# Patient Record
Sex: Male | Born: 1970 | Race: Black or African American | Hispanic: No | Marital: Married | Smoking: Never smoker
Health system: Southern US, Community
[De-identification: ages and names within clinical notes are randomized; demographics above are authoritative.]

---

## 2001-10-17 ENCOUNTER — Emergency Department (HOSPITAL_COMMUNITY): Admission: EM | Admit: 2001-10-17 | Discharge: 2001-10-17 | Payer: Self-pay | Admitting: Emergency Medicine

## 2005-05-18 ENCOUNTER — Encounter (INDEPENDENT_AMBULATORY_CARE_PROVIDER_SITE_OTHER): Payer: Self-pay | Admitting: *Deleted

## 2005-05-18 ENCOUNTER — Emergency Department (HOSPITAL_COMMUNITY): Admission: EM | Admit: 2005-05-18 | Discharge: 2005-05-18 | Payer: Self-pay | Admitting: Emergency Medicine

## 2005-05-25 ENCOUNTER — Emergency Department (HOSPITAL_COMMUNITY): Admission: EM | Admit: 2005-05-25 | Discharge: 2005-05-25 | Payer: Self-pay | Admitting: Emergency Medicine

## 2008-09-24 ENCOUNTER — Emergency Department (HOSPITAL_COMMUNITY): Admission: EM | Admit: 2008-09-24 | Discharge: 2008-09-24 | Payer: Self-pay | Admitting: Emergency Medicine

## 2013-08-09 ENCOUNTER — Emergency Department (HOSPITAL_COMMUNITY)
Admission: EM | Admit: 2013-08-09 | Discharge: 2013-08-09 | Disposition: A | Discharge status: Home / Self Care | Payer: Self-pay | Attending: Emergency Medicine | Admitting: Emergency Medicine

## 2013-08-09 ENCOUNTER — Emergency Department (HOSPITAL_COMMUNITY): Payer: Self-pay

## 2013-08-09 ENCOUNTER — Encounter (HOSPITAL_COMMUNITY): Payer: Self-pay | Admitting: Emergency Medicine

## 2013-08-09 DIAGNOSIS — S93499A Sprain of other ligament of unspecified ankle, initial encounter: Secondary | ICD-10-CM | POA: Insufficient documentation

## 2013-08-09 DIAGNOSIS — S8990XA Unspecified injury of unspecified lower leg, initial encounter: Secondary | ICD-10-CM | POA: Insufficient documentation

## 2013-08-09 DIAGNOSIS — X500XXA Overexertion from strenuous movement or load, initial encounter: Secondary | ICD-10-CM | POA: Insufficient documentation

## 2013-08-09 DIAGNOSIS — Y9367 Activity, basketball: Secondary | ICD-10-CM | POA: Insufficient documentation

## 2013-08-09 DIAGNOSIS — S99929A Unspecified injury of unspecified foot, initial encounter: Secondary | ICD-10-CM

## 2013-08-09 DIAGNOSIS — S96819A Strain of other specified muscles and tendons at ankle and foot level, unspecified foot, initial encounter: Principal | ICD-10-CM

## 2013-08-09 DIAGNOSIS — S99919A Unspecified injury of unspecified ankle, initial encounter: Secondary | ICD-10-CM

## 2013-08-09 DIAGNOSIS — Y92838 Other recreation area as the place of occurrence of the external cause: Secondary | ICD-10-CM

## 2013-08-09 DIAGNOSIS — S86011A Strain of right Achilles tendon, initial encounter: Secondary | ICD-10-CM

## 2013-08-09 DIAGNOSIS — Y9239 Other specified sports and athletic area as the place of occurrence of the external cause: Secondary | ICD-10-CM | POA: Insufficient documentation

## 2013-08-09 MED ORDER — HYDROCODONE-ACETAMINOPHEN 5-325 MG PO TABS: 1.0000 | ORAL_TABLET | ORAL | Status: DC | PRN

## 2013-08-09 NOTE — ED Provider Notes (Signed)
Patient seen and evaluated with Burgess AmorJulie Idol, PA. Exam shows complete disruption of the right Achilles tendon. No ecchymosis. Minimal early swelling. Plan orthopedic consultation, splint, crutches, nonweightbearing. Outpatient followup for surgical repair.  Rolland PorterMark Mirage Pfefferkorn, MD 08/09/13 [phone]

## 2013-08-09 NOTE — ED Notes (Signed)
Swelling pain rt ankle, injury yesterday when playing basketball.  Good DP pulse.  Unable to palp. Achilles tendon

## 2013-08-09 NOTE — ED Provider Notes (Signed)
CSN: [id number]     Arrival date & time 08/09/13  1753 History  This chart was scribed for non-physician practitioner, Burgess Amor, PA-C,working with Rolland Porter, MD, by Karle Plumber, ED Scribe. This patient was seen in room APFT22/APFT22 and the patient's care was started at 7:10 PM.  Chief Complaint  Patient presents with  . Ankle Pain  . Leg Pain   Patient is a 43 y.o. male presenting with ankle pain and leg pain. The history is provided by the patient. No language interpreter was used.  Ankle Pain Associated symptoms: no fever   Leg Pain Associated symptoms: no fever    HPI Comments:  Larry Mccarthy is a 43 y.o. male who presents to the Emergency Department complaining of severe pain and swelling of his right ankle pain that started yesterday secondary to playing basketball. He states he stepped on the foot wrong and feels as if he injured his achilles tendon. He states he did not feel a pop but thinks he may have heard a popping noise at the time of the incident. He reports alternating between heat and ice soaks earlier today. He denies taking anything for pain. He denies numbness, tingling, bruising or laceration to the area. He states he does not have a PCP.   History reviewed. No pertinent past medical history. History reviewed. No pertinent past surgical history. Family History  Problem Relation Age of Onset  . Cancer Father    History  Substance Use Topics  . Smoking status: Never Smoker   . Smokeless tobacco: Never Used  . Alcohol Use: 21.6 oz/week    1 Cans of beer, 35 Shots of liquor per week    Review of Systems  Constitutional: Negative for fever.  Musculoskeletal: Positive for arthralgias and joint swelling. Negative for myalgias.  Neurological: Negative for weakness and numbness.    Allergies  Review of patient's allergies indicates no known allergies.  Home Medications   Prior to Admission medications   Medication Sig Start Date End Date Taking? Authorizing  Provider  HYDROcodone-acetaminophen (NORCO/VICODIN) 5-325 MG per tablet Take 1 tablet by mouth every 4 (four) hours as needed. 08/09/13   Burgess Amor, PA-C   Triage Vitals: BP 99/85  Pulse 63  Temp(Src) 98.7 F (37.1 C) (Oral)  Resp 20  Ht 6\' 1"  (1.854 m)  Wt 165 lb (74.844 kg)  BMI 21.77 kg/m2  SpO2 100% Physical Exam  Constitutional: He appears well-developed and well-nourished.  HENT:  Head: Atraumatic.  Neck: Normal range of motion.  Cardiovascular:  Pulses equal bilaterally  Musculoskeletal: He exhibits edema and tenderness.  Moderate edema at right medial malleolus. Suspected disruption of achilles tendon distally. Positive Thompson test. Full pedal pulses.  Neurological: He is alert. He has normal strength. He displays normal reflexes. No sensory deficit.  Skin: Skin is warm and dry.  Psychiatric: He has a normal mood and affect.    ED Course  Procedures (including critical care time) DIAGNOSTIC STUDIES: Oxygen Saturation is 100% on RA, normal by my interpretation.   COORDINATION OF CARE: 7:15 PM- Will prescribe pain medication and refer to orthopedist. Offered pt pain medication prior to discharge but he declined. Pt verbalizes understanding and agrees to plan.  Medications - No data to display  Labs Review Labs Reviewed - No data to display  Imaging Review No results found.   EKG Interpretation None      MDM   Final diagnoses:  Achilles rupture, right, initial encounter    Exam c/w  complete achilles tear.  Pt placed in posterior splint, crutches given.  Hydrocodone, non weight bearing.  Referral to ortho for f/u in 24 hours. Pt was seen by Dr Fayrene FearingJames during this visit.  I personally performed the services described in this documentation, which was scribed in my presence. The recorded information has been reviewed and is accurate.    Burgess AmorJulie Delona Clasby, PA-C 08/23/13 1605

## 2013-08-09 NOTE — Discharge Instructions (Signed)
Achilles Tendon Rupture °with Phase I Rehab °A complete tear in the Achilles tendon is known as an Achilles tendon rupture. The Achilles tendon, which is also known as the heel cord, connects the large calf muscles (gastrocnemius and soleus) to the heel bone (calcaneus) and is essential for proper functioning of the calf muscles. The calf muscles are required for pushing the foot downward and are necessary for walking, running, and jumping. °SYMPTOMS  °· A "pop" or tear may be felt at the time of injury. °· Pain and weakness during movement, especially when raising the heel. °· Tenderness, swelling, warmth, and redness around the Achilles tendon. °· Bruising (contusion) around the back of the ankle after 48 hours. °· Loss of firmness to the touch over the area of the Achilles tendon that ruptured. °CAUSES  °· Achilles tendon rupture is most commonly caused by a sudden force placed upon the Achilles tendon that is greater than the tendon can withstand (for example jumping, hurdling, or sprinting). °· Achilles tendon rupture may also occur from direct trauma or injury to the lower leg, foot, or ankle. °RISK INCREASES WITH: °· Physical activity that involves sudden muscle contraction. °· Poor strength and flexibility of the lower leg. °· Previous injury to the tendon. °· Untreated Achilles tendinitis. °· Steroid injection into the Achilles tendon. °· Medical conditions, such as poor circulation due to a cardiovascular condition or obesity. °PREVENTION  °· Include a proper warm-up and stretching routine before physical activity. °· Allow for rest and recovery between physical activity. °· Maintain lower leg fitness: °¨ Flexibility. °¨ Muscle strength. °¨ Muscular endurance. °¨ Cardiovascular fitness. °· Mechanical prevention: °¨ Taping. °¨ Protective strapping. °¨ An adhesive bandaging that has been recommended prior to physical activity. °TREATMENT  °Initially one should not walk on the affected leg. One should also  ice the injured area, apply compression with an elastic bandage, and elevate the injured leg to eye level. Both surgical and nonsurgical interventions exist for definitive treatment. The return to sports is usually about the same with either treatment course but can occur a few weeks sooner with surgery.  °· Nonsurgical treatment typically requires the immobilization in the form of a long leg cast (from toes to groin) for 4 to 9 weeks. The long leg cast is followed by a short leg cast or walking boot for an additional 4 to 12 weeks. °¨ Advantages: Non-surgical treatment lacks the risks involved with anesthesia or surgery (such as infection, bleeding, or injury to nerves). °¨ Disadvantages: Non-surgical treatment involves a longer period of immobilization. This may result in stiffer ankle and knee joints. Also, the calf muscles are slightly weaker and the risk of repeat rupture is higher. °· Surgical treatment typically requires sewing the ends of the tendon back together, followed by immobilization (typically a short leg cast or walking boot). °¨ Advantages: Surgical treatment does not usually require the immobilization of the knee. Surgery also offers a lower risk of repeat rupture of the tendon and slightly stronger calf muscles. °¨ Disadvantages: Surgical treatment does include the risks of anesthesia and surgery. These risks include impaired wound healing and injury to a nerve that provides sensation to the side of the foot. °PROGNOSIS  °· Achilles tendon ruptures are typically curable if treated correctly. °· A period of 4 to 9 months is typical before a return to sports. °RELATED COMPLICATIONS  °· Calf muscle weakness may occur, especially if the rupture goes untreated. °· The possibility of repeat rupture exists despite treatment. °·   Prolonged disability can occur. °· Risks of surgery include infection, bleeding, injury to nerves, and impaired wound healing. °MEDICATION  °· If pain medication is necessary,  then nonsteroidal anti-inflammatory medications, such as aspirin and ibuprofen, or other minor pain relievers, such as acetaminophen, are often recommended. °· Do not take pain medication within 7 days before surgery. °· All medications should be taken under the direction of your caregiver. Contact your caregiver immediately if any bleeding, stomach upset, or signs of allergic reaction occur. °· Prescription pain medication may be prescribed as necessary by your caregiver. Use only as directed and only as much as you need. °SEEK MEDICAL CARE IF:  °· Pain continues to increase, despite treatment. °· Cast discomfort develops. °· New, unexplained symptoms develop. °· You are experiencing any side effects form the drugs used in treatment. °EXERCISES  °PHASE I EXERCISES °RANGE OF MOTION (ROM) AND STRETCHING EXERCISES - Achilles Tendon Rupture Phase I  °These exercises will help you begin to restore your ankle flexibility in the first 2 to 4 weeks after your cast is removed. Your physician, physical therapist, or athletic trainer will progress your exercise once you have demonstrated the sufficient gains in flexibility and strength. While completing these exercises, remember:  °· Restoring tissue flexibility helps normal motion to return to the joints. This allows healthier, less painful movement and activity. °· An effective stretch should be held for at least 30 seconds. °· A stretch should never be painful. You should only feel a gentle lengthening or release in the stretched tissue. °RANGE OF MOTION - Dorsi/Plantar Flexion °· While sitting with your right / left knee straight, draw the top of your foot upwards by flexing your ankle. Then reverse the motion, pointing your toes downward. °· Hold each position for __________ seconds. °· After completing your first set of exercises, repeat this exercise with your knee bent. °Repeat __________ times. Complete this exercise __________ times per day.  °RANGE OF MOTION -  Ankle Alphabet °· Imagine your right / left big toe is a pen. °· Keeping your hip and knee still, write out the entire alphabet with your "pen." Make the letters as large as you can without increasing any discomfort. °Repeat __________ times. Complete this exercise __________ times per day.  °RANGE OF MOTION - Ankle Dorsiflexion, Active Assisted  °· Remove shoes and sit on a chair that is preferably not on a carpeted surface. °· Place right / left foot under knee. Extend your opposite leg for support. °· Keeping your heel down, slide your right / left foot back toward the chair until you feel a stretch at your ankle or calf. If you do not feel a stretch, slide your bottom forward to the edge of the chair, while still keeping your heel down. °· Hold this stretch for __________ seconds. °Repeat __________ times. Complete this stretch __________ times per day.  °STRETCH - Gastrocsoleus  °· Sit with your right / left leg extended. Holding onto both ends of a belt or towel, loop it around the ball of your foot. °· Keeping your right / left ankle and foot relaxed and your knee straight, pull your foot and ankle toward you using the belt/towel. °· You should feel a gentle stretch behind your calf or knee. Hold this position for __________ seconds. °Repeat __________ times. Complete this stretch __________ times per day.  °STRENGTHENING EXERCISES - Achilles Tendon Rupture Phase I  °These exercises will help you begin to restore your ankle strength the first 2 to   4 weeks after your cast is removed. Your physician, physical therapist, or athletic trainer will progress your exercise once you have demonstrated the sufficient gains in flexibility and strength. While completing these exercises, remember:  °· Muscles can gain both the endurance and the strength needed for everyday activities through controlled exercises. °· Complete these exercises as instructed by your physician, physical therapist, or athletic trainer. Progress  the resistance and repetitions only as guided. °· You may experience muscle soreness or fatigue, but the pain or discomfort you are trying to eliminate should never worsen during these exercises. If this pain does worsen, stop and make certain you are following the directions exactly. If the pain is still present after adjustments, discontinue the exercise until you can discuss the trouble with your clinician. °STRENGTH - Dorsiflexors °· Secure a rubber exercise band/tubing to a fixed object (for example a table or pole) and loop the other end around your right / left foot. °· Sit on the floor facing the fixed object. The band/tubing should be slightly tense when your foot is relaxed. °· Slowly draw your foot back toward you using your ankle and toes. °· Hold this position for __________ seconds. Slowly release the tension in the band and return your foot to the starting position. °Repeat __________ times. Complete this exercise __________ times per day.  °STRENGTH - Plantar-flexors  °· Sit with your right / left leg extended. Holding onto both ends of a rubber exercise band/tubing, loop it around the ball of your foot. Keep a slight tension in the band. °· Slowly push your toes away from you, pointing them downward. °· Hold this position for __________ seconds. Return slowly, controlling the tension in the band/tubing. °Repeat __________ times. Complete this exercise __________ times per day.  °STRENGTH - Towel Curls °· Sit in a chair positioned on a non-carpeted surface. °· Place your foot on a towel, keeping your heel on the floor. °· Pull the towel toward your heel by only curling your toes. Keep your heel on the floor. °· If instructed by your physician, physical therapist or athletic trainer, add weight to the end of the towel. °Repeat __________ times. Complete this exercise __________ times per day. °STRENGTH - Ankle Eversion  °· Secure one end of a rubber exercise band/tubing to a fixed object (table,  pole). Loop the other end around your foot just before your toes. °· Place your fists between your knees. This will focus your strengthening at your ankle. °· Drawing the band/tubing across your opposite foot, slowly, pull your little toe out and up. Make sure the band/tubing is positioned to resist the entire motion. °· Hold this position for __________ seconds. °· Have your muscles resist the band/tubing as it slowly pulls your foot back to the starting position. °Repeat __________ times. Complete this exercise __________ times per day.  °STRENGTH - Ankle Inversion °· Secure one end of a rubber exercise band/tubing to a fixed object (table, pole). Loop the other end around your foot just before your toes. °· Place your fists between your knees. This will focus your strengthening at your ankle. °· Slowly, pull your big toe up and in, making sure the band/tubing is positioned to resist the entire motion. °· Hold this position for __________ seconds. °· Have your muscles resist the band/tubing as it slowly pulls your foot back to the starting position. °Repeat __________ times. Complete this exercises __________ times per day.  °STRENGTH - Plantar Flexors, Seated  °· Sit on a chair   that allows your feet to rest flat on the ground. If necessary, sit at the edge of the chair. °· Keeping your toes firmly on the ground, lift your right / left heel as far as you can without increasing any discomfort in your ankle. °Repeat __________ times. Complete this exercise __________ times a day. °*If instructed by your physician, physical therapist, or athletic trainer, you may add ____________________ of resistance by placing a weighted object on your knee. °Document Released: 12/24/2004 Document Revised: 03/18/2011 Document Reviewed: 04/07/2008 °[employer]® Patient Information ©2015 [employer]. This information is not intended to replace advice given to you by your health care provider. Make sure you discuss any questions you  have with your health care provider. ° °

## 2013-08-09 NOTE — ED Notes (Signed)
Patient c/o lower leg and ankle pain. Patient reports hurting ankle while playing basketball yesterday. Swelling noted in ankle.

## 2013-08-24 NOTE — ED Provider Notes (Signed)
Medical screening examination/treatment/procedure(s) were performed by non-physician practitioner and as supervising physician I was immediately available for consultation/collaboration.   EKG Interpretation None        Andersyn Fragoso, MD 08/24/13 0650 

## 2018-07-05 ENCOUNTER — Observation Stay (HOSPITAL_COMMUNITY): Payer: Self-pay

## 2018-07-05 ENCOUNTER — Emergency Department (HOSPITAL_COMMUNITY): Payer: Self-pay

## 2018-07-05 ENCOUNTER — Encounter (HOSPITAL_COMMUNITY): Payer: Self-pay

## 2018-07-05 ENCOUNTER — Observation Stay (HOSPITAL_COMMUNITY)
Admission: EM | Admit: 2018-07-05 | Discharge: 2018-07-06 | Disposition: A | Discharge status: Home / Self Care | Payer: Self-pay | Attending: Surgery | Admitting: Surgery

## 2018-07-05 DIAGNOSIS — S2241XA Multiple fractures of ribs, right side, initial encounter for closed fracture: Secondary | ICD-10-CM | POA: Insufficient documentation

## 2018-07-05 DIAGNOSIS — S12391A Other nondisplaced fracture of fourth cervical vertebra, initial encounter for closed fracture: Principal | ICD-10-CM | POA: Insufficient documentation

## 2018-07-05 DIAGNOSIS — J939 Pneumothorax, unspecified: Secondary | ICD-10-CM | POA: Insufficient documentation

## 2018-07-05 DIAGNOSIS — R918 Other nonspecific abnormal finding of lung field: Secondary | ICD-10-CM | POA: Insufficient documentation

## 2018-07-05 DIAGNOSIS — S12300A Unspecified displaced fracture of fourth cervical vertebra, initial encounter for closed fracture: Secondary | ICD-10-CM | POA: Diagnosis present

## 2018-07-05 DIAGNOSIS — F10920 Alcohol use, unspecified with intoxication, uncomplicated: Secondary | ICD-10-CM | POA: Insufficient documentation

## 2018-07-05 DIAGNOSIS — Y998 Other external cause status: Secondary | ICD-10-CM | POA: Insufficient documentation

## 2018-07-05 DIAGNOSIS — Y9389 Activity, other specified: Secondary | ICD-10-CM | POA: Insufficient documentation

## 2018-07-05 DIAGNOSIS — Y929 Unspecified place or not applicable: Secondary | ICD-10-CM | POA: Insufficient documentation

## 2018-07-05 DIAGNOSIS — S060X1A Concussion with loss of consciousness of 30 minutes or less, initial encounter: Secondary | ICD-10-CM | POA: Insufficient documentation

## 2018-07-05 DIAGNOSIS — Z03818 Encounter for observation for suspected exposure to other biological agents ruled out: Secondary | ICD-10-CM | POA: Insufficient documentation

## 2018-07-05 LAB — URINALYSIS, ROUTINE W REFLEX MICROSCOPIC
Bacteria, UA: NONE SEEN
Bilirubin Urine: NEGATIVE
Glucose, UA: NEGATIVE mg/dL
Ketones, ur: NEGATIVE mg/dL
Leukocytes,Ua: NEGATIVE
Nitrite: NEGATIVE
Protein, ur: NEGATIVE mg/dL
Specific Gravity, Urine: 1.025 (ref 1.005–1.030)
pH: 5 (ref 5.0–8.0)

## 2018-07-05 LAB — COMPREHENSIVE METABOLIC PANEL
ALT: 22 U/L (ref 0–44)
AST: 35 U/L (ref 15–41)
Albumin: 4 g/dL (ref 3.5–5.0)
Alkaline Phosphatase: 42 U/L (ref 38–126)
Anion gap: 10 (ref 5–15)
BUN: 12 mg/dL (ref 6–20)
CO2: 24 mmol/L (ref 22–32)
Calcium: 8.8 mg/dL — ABNORMAL LOW (ref 8.9–10.3)
Chloride: 104 mmol/L (ref 98–111)
Creatinine, Ser: 1.22 mg/dL (ref 0.61–1.24)
GFR calc Af Amer: >60 mL/min (ref >60)
GFR calc non Af Amer: >60 mL/min (ref >60)
Glucose, Bld: 120 mg/dL — ABNORMAL HIGH (ref 70–99)
Potassium: 3.6 mmol/L (ref 3.5–5.1)
Sodium: 138 mmol/L (ref 135–145)
Total Bilirubin: 0.6 mg/dL (ref 0.3–1.2)
Total Protein: 6.3 g/dL — ABNORMAL LOW (ref 6.5–8.1)

## 2018-07-05 LAB — I-STAT CHEM 8, ED
BUN: 14 mg/dL (ref 6–20)
Calcium, Ion: 0.93 mmol/L — ABNORMAL LOW (ref 1.15–1.40)
Chloride: 106 mmol/L (ref 98–111)
Creatinine, Ser: 1.3 mg/dL — ABNORMAL HIGH (ref 0.61–1.24)
Glucose, Bld: 115 mg/dL — ABNORMAL HIGH (ref 70–99)
HCT: 42 % (ref 39.0–52.0)
Hemoglobin: 14.3 g/dL (ref 13.0–17.0)
Potassium: 3.7 mmol/L (ref 3.5–5.1)
Sodium: 138 mmol/L (ref 135–145)
TCO2: 24 mmol/L (ref 22–32)

## 2018-07-05 LAB — CBC
HCT: 39.4 % (ref 39.0–52.0)
Hemoglobin: 14 g/dL (ref 13.0–17.0)
MCH: 31.3 pg (ref 26.0–34.0)
MCHC: 35.5 g/dL (ref 30.0–36.0)
MCV: 87.9 fL (ref 80.0–100.0)
Platelets: 257 10*3/uL (ref 150–400)
RBC: 4.48 MIL/uL (ref 4.22–5.81)
RDW: 12.5 % (ref 11.5–15.5)
WBC: 18.9 10*3/uL — ABNORMAL HIGH (ref 4.0–10.5)
nRBC: 0 % (ref 0.0–0.2)

## 2018-07-05 LAB — SAMPLE TO BLOOD BANK

## 2018-07-05 LAB — PROTIME-INR
INR: 1.1 (ref 0.8–1.2)
Prothrombin Time: 14 seconds (ref 11.4–15.2)

## 2018-07-05 LAB — LACTIC ACID, PLASMA: Lactic Acid, Venous: 2.3 mmol/L (ref 0.5–1.9)

## 2018-07-05 LAB — SARS CORONAVIRUS 2 BY RT PCR (HOSPITAL ORDER, PERFORMED IN ~~LOC~~ HOSPITAL LAB): SARS Coronavirus 2: NEGATIVE

## 2018-07-05 LAB — TROPONIN I (HIGH SENSITIVITY)
Troponin I (High Sensitivity): 2 ng/L (ref <18)
Troponin I (High Sensitivity): 3 ng/L (ref <18)

## 2018-07-05 LAB — HIV ANTIBODY (ROUTINE TESTING W REFLEX): HIV Screen 4th Generation wRfx: NONREACTIVE

## 2018-07-05 LAB — ETHANOL: Alcohol, Ethyl (B): 160 mg/dL — ABNORMAL HIGH (ref <10)

## 2018-07-05 MED ORDER — METHOCARBAMOL 500 MG PO TABS
500.0000 mg | ORAL_TABLET | Freq: Three times a day (TID) | ORAL | Status: DC | PRN
  Filled 2018-07-05: qty 1

## 2018-07-05 MED ORDER — FOLIC ACID 1 MG PO TABS
1.0000 mg | ORAL_TABLET | Freq: Every day | ORAL | Status: DC
  Administered 2018-07-05 – 2018-07-06 (×2): 1 mg via ORAL
  Filled 2018-07-05 (×2): qty 1

## 2018-07-05 MED ORDER — ACETAMINOPHEN 325 MG PO TABS
650.0000 mg | ORAL_TABLET | ORAL | Status: DC | PRN
  Administered 2018-07-05: 650 mg via ORAL
  Filled 2018-07-05 (×2): qty 2

## 2018-07-05 MED ORDER — MORPHINE SULFATE (PF) 2 MG/ML IV SOLN: 2.0000 mg | INTRAVENOUS | Status: DC | PRN

## 2018-07-05 MED ORDER — SODIUM CHLORIDE 0.9 % IV SOLN: INTRAVENOUS | Status: DC

## 2018-07-05 MED ORDER — DOCUSATE SODIUM 100 MG PO CAPS
100.0000 mg | ORAL_CAPSULE | Freq: Two times a day (BID) | ORAL | Status: DC
  Administered 2018-07-05 – 2018-07-06 (×3): 100 mg via ORAL
  Filled 2018-07-05 (×3): qty 1

## 2018-07-05 MED ORDER — IOHEXOL 300 MG/ML  SOLN
100.0000 mL | Freq: Once | INTRAMUSCULAR | Status: AC | PRN
  Administered 2018-07-05: 100 mL via INTRAVENOUS

## 2018-07-05 MED ORDER — LORAZEPAM 2 MG/ML IJ SOLN: 1.0000 mg | Freq: Four times a day (QID) | INTRAMUSCULAR | Status: DC | PRN

## 2018-07-05 MED ORDER — SODIUM CHLORIDE 0.9 % IV BOLUS
1000.0000 mL | Freq: Once | INTRAVENOUS | Status: AC
  Administered 2018-07-05: 01:00:00 1000 mL via INTRAVENOUS

## 2018-07-05 MED ORDER — OXYCODONE HCL 5 MG PO TABS
5.0000 mg | ORAL_TABLET | ORAL | Status: DC | PRN
  Administered 2018-07-05: 5 mg via ORAL
  Filled 2018-07-05: qty 2

## 2018-07-05 MED ORDER — LORAZEPAM 1 MG PO TABS: 1.0000 mg | ORAL_TABLET | Freq: Four times a day (QID) | ORAL | Status: DC | PRN

## 2018-07-05 MED ORDER — VITAMIN B-1 100 MG PO TABS
100.0000 mg | ORAL_TABLET | Freq: Every day | ORAL | Status: DC
  Administered 2018-07-05 – 2018-07-06 (×2): 100 mg via ORAL
  Filled 2018-07-05 (×2): qty 1

## 2018-07-05 MED ORDER — THIAMINE HCL 100 MG/ML IJ SOLN: 100.0000 mg | Freq: Every day | INTRAMUSCULAR | Status: DC

## 2018-07-05 MED ORDER — SODIUM CHLORIDE 0.9 % IV BOLUS
1000.0000 mL | Freq: Once | INTRAVENOUS | Status: AC
  Administered 2018-07-05: 1000 mL via INTRAVENOUS

## 2018-07-05 MED ORDER — SODIUM CHLORIDE 0.9 % IV SOLN
INTRAVENOUS | Status: DC
  Administered 2018-07-05: 07:00:00 via INTRAVENOUS

## 2018-07-05 MED ORDER — ONDANSETRON HCL 4 MG/2ML IJ SOLN: 4.0000 mg | Freq: Four times a day (QID) | INTRAMUSCULAR | Status: DC | PRN

## 2018-07-05 MED ORDER — ADULT MULTIVITAMIN W/MINERALS CH
1.0000 | ORAL_TABLET | Freq: Every day | ORAL | Status: DC
  Administered 2018-07-05 – 2018-07-06 (×2): 1 via ORAL
  Filled 2018-07-05 (×2): qty 1

## 2018-07-05 MED ORDER — ONDANSETRON 4 MG PO TBDP: 4.0000 mg | ORAL_TABLET | Freq: Four times a day (QID) | ORAL | Status: DC | PRN
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
## 2018-07-05 NOTE — ED Notes (Signed)
Please call Shlomie Romig spouse @ [phone] a status update--Brailyn Delman
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
## 2018-07-05 NOTE — Plan of Care (Signed)
Pt has walked the hall with PT, now sitting in chair. Only pain medication given so far was Tylenol.

## 2018-07-05 NOTE — Progress Notes (Signed)
Tele report: SR [phone].
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
## 2018-07-06 ENCOUNTER — Observation Stay (HOSPITAL_COMMUNITY): Payer: Self-pay

## 2018-07-06 MED ORDER — OXYCODONE HCL 5 MG PO TABS: 5.0000 mg | ORAL_TABLET | Freq: Four times a day (QID) | ORAL | 0 refills | Status: DC | PRN
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
## 2018-07-06 NOTE — Progress Notes (Signed)
Patient discharged home. Family here for transport. All discharge paperwork went over with patient. All questions and concerns addressed. IV taken out. All belongings sent with patient. Patient taken down in wheelchair. Larry Mccarthy
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
## 2020-07-03 IMAGING — DX PORTABLE CHEST - 1 VIEW
1 series · 1 of 1 positions shown · non-contrast
Comparison: 07/05/2018; chest CT-07/05/2018

CLINICAL DATA: Evaluate right apical pneumothorax.

EXAM:
PORTABLE CHEST 1 VIEW

[chest ap]
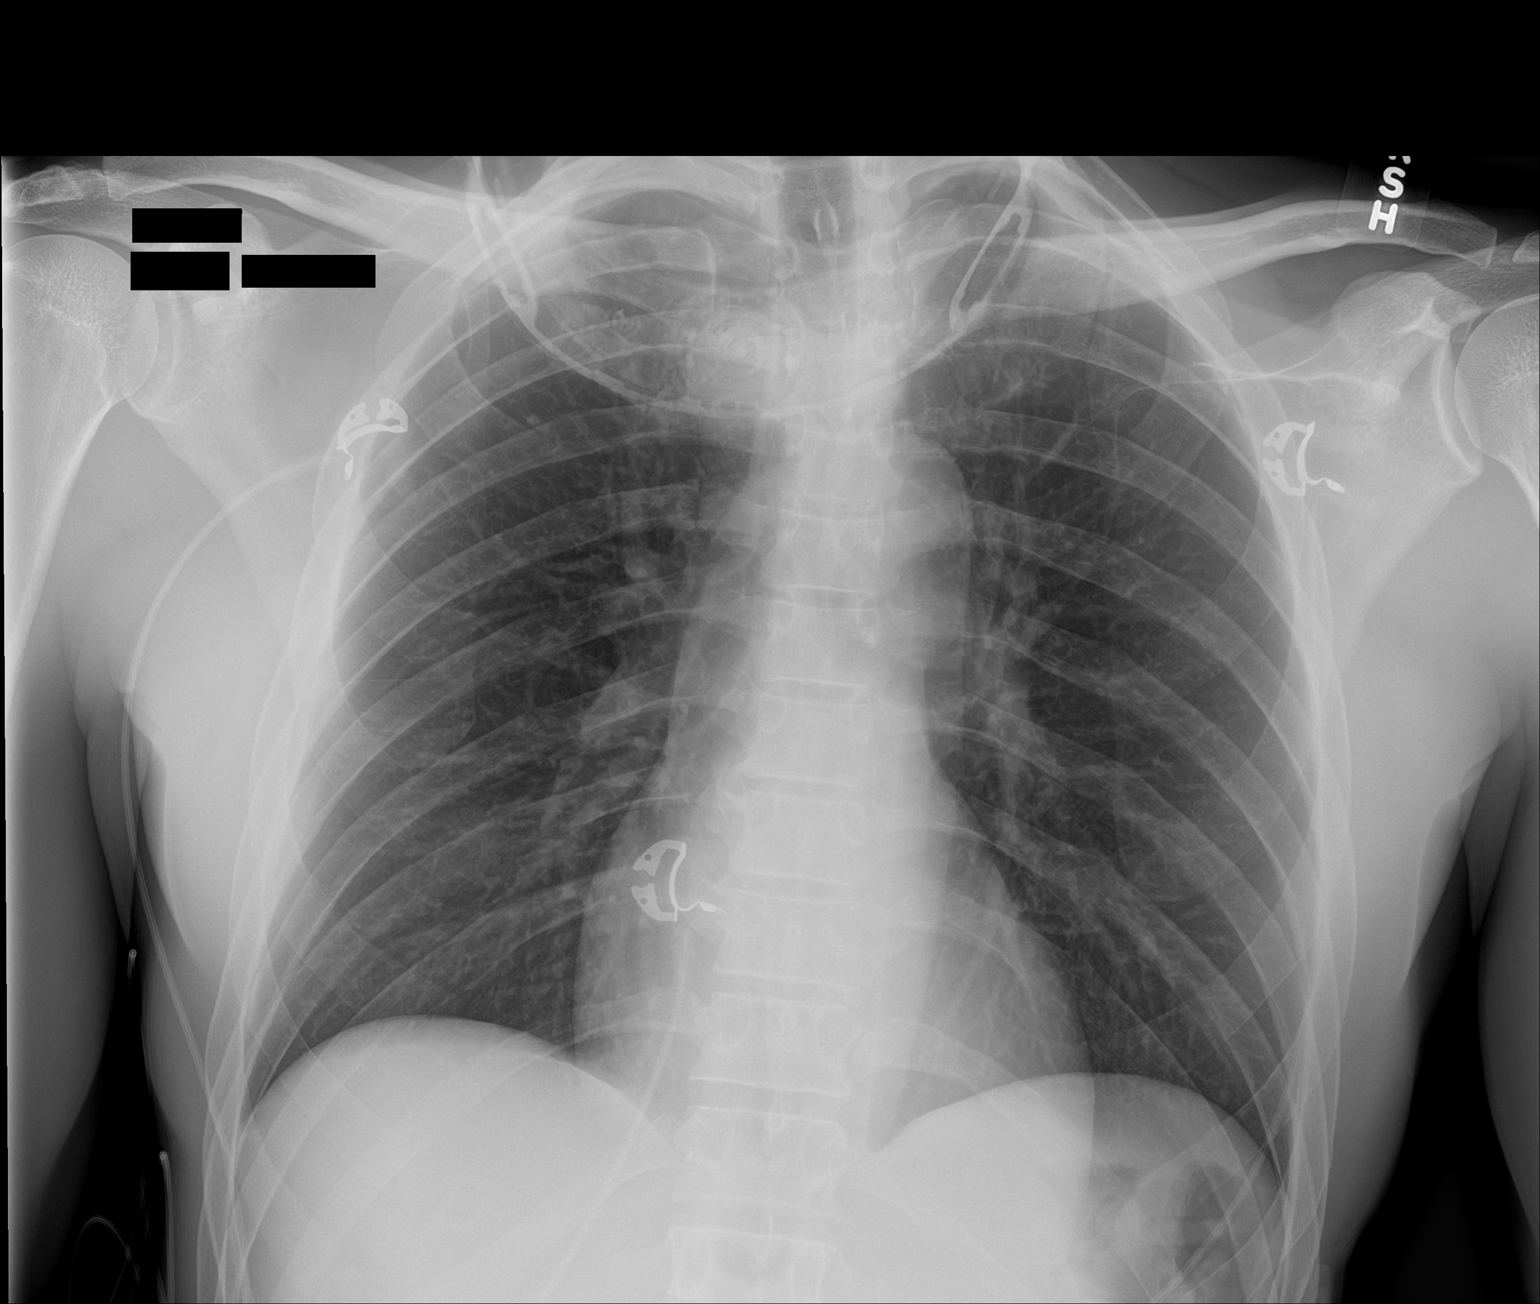

[1 of 1 positions shown; findings below may reference images not displayed]

FINDINGS: Grossly unchanged cardiac silhouette and mediastinal contours.
Unchanged tiny right apical pneumothorax. No focal airspace
opacities. No pleural effusion. No evidence of edema. No acute
osseous abnormalities.
IMPRESSION: Unchanged tiny right apical pneumothorax.

## 2021-03-05 DIAGNOSIS — Z7689 Persons encountering health services in other specified circumstances: Secondary | ICD-10-CM | POA: Diagnosis not present

## 2021-03-05 DIAGNOSIS — Z1211 Encounter for screening for malignant neoplasm of colon: Secondary | ICD-10-CM | POA: Diagnosis not present

## 2021-03-05 DIAGNOSIS — M71349 Other bursal cyst, unspecified hand: Secondary | ICD-10-CM | POA: Diagnosis not present

## 2021-03-05 DIAGNOSIS — R030 Elevated blood-pressure reading, without diagnosis of hypertension: Secondary | ICD-10-CM | POA: Diagnosis not present

## 2021-03-07 ENCOUNTER — Encounter (INDEPENDENT_AMBULATORY_CARE_PROVIDER_SITE_OTHER): Payer: Self-pay | Admitting: *Deleted

## 2021-03-21 DIAGNOSIS — Z7689 Persons encountering health services in other specified circumstances: Secondary | ICD-10-CM | POA: Diagnosis not present

## 2021-03-26 DIAGNOSIS — R2231 Localized swelling, mass and lump, right upper limb: Secondary | ICD-10-CM | POA: Diagnosis not present

## 2021-03-28 DIAGNOSIS — Z0001 Encounter for general adult medical examination with abnormal findings: Secondary | ICD-10-CM | POA: Diagnosis not present

## 2021-04-03 DIAGNOSIS — R2231 Localized swelling, mass and lump, right upper limb: Secondary | ICD-10-CM | POA: Diagnosis not present

## 2021-04-03 DIAGNOSIS — M25441 Effusion, right hand: Secondary | ICD-10-CM | POA: Diagnosis not present

## 2021-04-10 ENCOUNTER — Ambulatory Visit (HOSPITAL_COMMUNITY)
Admission: EM | Admit: 2021-04-10 | Discharge: 2021-04-10 | Disposition: A | Discharge status: Home / Self Care | Payer: BC Managed Care – PPO | Attending: Student | Admitting: Student

## 2021-04-10 ENCOUNTER — Encounter (HOSPITAL_COMMUNITY): Payer: Self-pay

## 2021-04-10 DIAGNOSIS — L03011 Cellulitis of right finger: Secondary | ICD-10-CM

## 2021-04-10 MED ORDER — CEPHALEXIN 500 MG PO CAPS: 500.0000 mg | ORAL_CAPSULE | Freq: Four times a day (QID) | ORAL | 0 refills | Status: DC

## 2021-04-10 NOTE — ED Provider Notes (Signed)
?[facility] ? ? ? ?CSN: [id number] ?Arrival date & time: 04/10/21  1848 ? ? ?  ? ?History   ?Chief Complaint ?Chief Complaint  ?Patient presents with  ? Finger Infection  ? ? ?HPI ?Larry Mccarthy is a 51 y.o. male presenting with right pinky finger pain and swelling for 2 days.  Here today with wife who states that he bites his nails.  States the swelling has only been getting worse, has tried warm compresses without relief.  He is not a diabetic. ? ?HPI ? ?History reviewed. No pertinent past medical history. ? ?Patient Active Problem List  ? Diagnosis Date Noted  ? C4 cervical fracture (HCC) 07/05/2018  ? ? ?History reviewed. No pertinent surgical history. ? ? ? ? ?Home Medications   ? ?Prior to Admission medications   ?Medication Sig Start Date End Date Taking? Authorizing Provider  ?cephALEXin (KEFLEX) 500 MG capsule Take 1 capsule (500 mg total) by mouth 4 (four) times daily. 04/10/21  Yes Rhys Martini, PA-C  ?oxyCODONE (OXY IR/ROXICODONE) 5 MG immediate release tablet Take 1 tablet (5 mg total) by mouth every 6 (six) hours as needed for moderate pain or severe pain (severe pain not controlled with tylenol and ibuprofen). 07/06/18   Andria Meuse, MD  ? ? ?Family History ?Family History  ?Problem Relation Age of Onset  ? Cancer Father   ? ? ?Social History ?Social History  ? ?Tobacco Use  ? Smoking status: Never  ? Smokeless tobacco: Never  ?Substance Use Topics  ? Alcohol use: Yes  ?  Alcohol/week: 36.0 standard drinks  ?  Types: 1 Cans of beer, 35 Shots of liquor per week  ? Drug use: Yes  ?  Types: Marijuana  ? ? ? ?Allergies   ?Patient has no known allergies. ? ? ?Review of Systems ?Review of Systems  ?Skin:  Positive for color change.  ?All other systems reviewed and are negative. ? ? ?Physical Exam ?Triage Vital Signs ?ED Triage Vitals  ?Enc Vitals Group  ?   BP 04/10/21 1922 138/90  ?   Pulse Rate 04/10/21 1922 (!) 58  ?   Resp 04/10/21 1922 17  ?   Temp 04/10/21 1922 98.2 ?F (36.8 ?C)  ?    Temp Source 04/10/21 1922 Oral  ?   SpO2 04/10/21 1922 100 %  ?   Weight --   ?   Height --   ?   Head Circumference --   ?   Peak Flow --   ?   Pain Score 04/10/21 1924 8  ?   Pain Loc --   ?   Pain Edu? --   ?   Excl. in GC? --   ? ?No data found. ? ?Updated Vital Signs ?BP 138/90 (BP Location: Left Arm)   Pulse (!) 58   Temp 98.2 ?F (36.8 ?C) (Oral)   Resp 17   SpO2 100%  ? ?Visual Acuity ?Right Eye Distance:   ?Left Eye Distance:   ?Bilateral Distance:   ? ?Right Eye Near:   ?Left Eye Near:    ?Bilateral Near:    ? ?Physical Exam ?Vitals reviewed.  ?Constitutional:   ?   General: He is not in acute distress. ?   Appearance: Normal appearance. He is not ill-appearing.  ?HENT:  ?   Head: Normocephalic and atraumatic.  ?Pulmonary:  ?   Effort: Pulmonary effort is normal.  ?Skin: ?   Comments: See image below ?R little  finger with induration to the medial nail fold. No fluctuance. No spontaneous drainage. Swelling doesn't extend to DIP. ROM DIP and PIP intact but with pain. Cap refill <2 seconds. No fusiform swelling or sausage digit.  ?Neurological:  ?   General: No focal deficit present.  ?   Mental Status: He is alert and oriented to person, place, and time.  ?Psychiatric:     ?   Mood and Affect: Mood normal.     ?   Behavior: Behavior normal.     ?   Thought Content: Thought content normal.     ?   Judgment: Judgment normal.  ? ? ? ? ?UC Treatments / Results  ?Labs ?(all labs ordered are listed, but only abnormal results are displayed) ?Labs Reviewed - No data to display ? ?EKG ? ? ?Radiology ?No results found. ? ?Procedures ?Incision and Drainage ? ?Date/Time: 04/10/2021 7:43 PM ?Performed by: Rhys MartiniGraham, Treyden Hakim E, PA-C ?Authorized by: Rhys MartiniGraham, Krisinda Giovanni E, PA-C  ? ?Consent:  ?  Consent obtained:  Verbal ?  Consent given by:  Patient and spouse ?  Risks, benefits, and alternatives were discussed: yes   ?  Risks discussed:  Bleeding, incomplete drainage and pain ?  Alternatives discussed:  No treatment ?Universal  protocol:  ?  Procedure explained and questions answered to patient or proxy's satisfaction: yes   ?  Immediately prior to procedure, a time out was called: yes   ?  Patient identity confirmed:  Verbally with patient and arm band ?Location:  ?  Type:  Abscess ?  Location:  Upper extremity ?  Upper extremity location:  Finger ?  Finger location:  R small finger ?Pre-procedure details:  ?  Skin preparation:  Antiseptic wash ?Anesthesia:  ?  Anesthesia method:  Topical application ?Procedure details:  ?  Needle aspiration: yes   ?  Needle size:  22 G ?  Incision types:  Stab incision ?  Drainage:  Purulent ?  Drainage amount:  Scant ?Post-procedure details:  ?  Procedure completion:  Tolerated well, no immediate complications ?Comments:  ?   Wound is indurated with no fluctuance. Discussed that it is immature and not ready for drainage. Wife requesting I attempt I&D. Attempted with two stab incisions using 22 gauge needle. Minimal purulent drainage elicited.  (including critical care time) ? ?Medications Ordered in UC ?Medications - No data to display ? ?Initial Impression / Assessment and Plan / UC Course  ?I have reviewed the triage vital signs and the nursing notes. ? ?Pertinent labs & imaging results that were available during my care of the patient were reviewed by me and considered in my medical decision making (see chart for details). ? ?  ? ?This patient is a very pleasant 51 y.o. year old male presenting with R little finger paronychia. Neurovascularly intact. Attempted I&D as requested by wife; minimal drainage as paronychia is immature. Keflex sent. He is not immunocompromised. Stop biting nails. ED return precautions discussed. Patient verbalizes understanding and agreement.  ? ? ?Final Clinical Impressions(s) / UC Diagnoses  ? ?Final diagnoses:  ?Paronychia of right little finger  ? ? ? ?Discharge Instructions   ? ?  ?-Start the antibiotic: Keflex, 4x daily x5 days. You can take this with food if you have  a sensitive stomach. ?-Soak in warm water or warm compress ?-Wash with gentle soap and water only ?-Follow-up if pain getting worse instead of better - swelling extends to your knuckle, trouble bending the finger, new fevers,  etc ? ? ?ED Prescriptions   ? ? Medication Sig Dispense Auth. Provider  ? cephALEXin (KEFLEX) 500 MG capsule Take 1 capsule (500 mg total) by mouth 4 (four) times daily. 20 capsule Rhys Martini, PA-C  ? ?  ? ?PDMP not reviewed this encounter. ?  ?Rhys Martini, PA-C ?04/10/21 1946 ? ?

## 2021-04-10 NOTE — ED Triage Notes (Signed)
Pt presents with infection in tip of right pinky finger around nail X 2 days. ?

## 2021-04-10 NOTE — Discharge Instructions (Addendum)
-  Start the antibiotic: Keflex, 4x daily x5 days. You can take this with food if you have a sensitive stomach. ?-Soak in warm water or warm compress ?-Wash with gentle soap and water only ?-Follow-up if pain getting worse instead of better - swelling extends to your knuckle, trouble bending the finger, new fevers, etc ?

## 2021-04-11 DIAGNOSIS — R2231 Localized swelling, mass and lump, right upper limb: Secondary | ICD-10-CM | POA: Diagnosis not present

## 2021-04-11 DIAGNOSIS — L03011 Cellulitis of right finger: Secondary | ICD-10-CM | POA: Diagnosis not present

## 2021-09-14 ENCOUNTER — Encounter (INDEPENDENT_AMBULATORY_CARE_PROVIDER_SITE_OTHER): Payer: Self-pay | Admitting: *Deleted

## 2021-09-24 DIAGNOSIS — R030 Elevated blood-pressure reading, without diagnosis of hypertension: Secondary | ICD-10-CM | POA: Diagnosis not present

## 2021-10-01 DIAGNOSIS — M71349 Other bursal cyst, unspecified hand: Secondary | ICD-10-CM | POA: Diagnosis not present

## 2021-10-01 DIAGNOSIS — R030 Elevated blood-pressure reading, without diagnosis of hypertension: Secondary | ICD-10-CM | POA: Diagnosis not present

## 2021-10-01 DIAGNOSIS — Z1211 Encounter for screening for malignant neoplasm of colon: Secondary | ICD-10-CM | POA: Diagnosis not present

## 2021-10-01 DIAGNOSIS — R944 Abnormal results of kidney function studies: Secondary | ICD-10-CM | POA: Diagnosis not present

## 2021-10-02 ENCOUNTER — Encounter: Payer: Self-pay | Admitting: *Deleted

## 2022-03-08 ENCOUNTER — Encounter: Payer: Self-pay | Admitting: *Deleted

## 2022-06-10 DIAGNOSIS — R030 Elevated blood-pressure reading, without diagnosis of hypertension: Secondary | ICD-10-CM | POA: Diagnosis not present

## 2022-06-10 DIAGNOSIS — R7301 Impaired fasting glucose: Secondary | ICD-10-CM | POA: Diagnosis not present

## 2022-07-06 DIAGNOSIS — R030 Elevated blood-pressure reading, without diagnosis of hypertension: Secondary | ICD-10-CM | POA: Diagnosis not present

## 2022-07-06 DIAGNOSIS — Z1211 Encounter for screening for malignant neoplasm of colon: Secondary | ICD-10-CM | POA: Diagnosis not present

## 2022-07-06 DIAGNOSIS — Z0000 Encounter for general adult medical examination without abnormal findings: Secondary | ICD-10-CM | POA: Diagnosis not present

## 2022-07-06 DIAGNOSIS — R944 Abnormal results of kidney function studies: Secondary | ICD-10-CM | POA: Diagnosis not present

## 2023-07-17 DIAGNOSIS — I10 Essential (primary) hypertension: Secondary | ICD-10-CM | POA: Diagnosis not present

## 2023-07-17 DIAGNOSIS — R7301 Impaired fasting glucose: Secondary | ICD-10-CM | POA: Diagnosis not present

## 2023-07-22 DIAGNOSIS — Z0001 Encounter for general adult medical examination with abnormal findings: Secondary | ICD-10-CM | POA: Diagnosis not present

## 2023-07-22 DIAGNOSIS — R030 Elevated blood-pressure reading, without diagnosis of hypertension: Secondary | ICD-10-CM | POA: Diagnosis not present

## 2023-07-22 DIAGNOSIS — Z0000 Encounter for general adult medical examination without abnormal findings: Secondary | ICD-10-CM | POA: Diagnosis not present

## 2023-07-22 DIAGNOSIS — D649 Anemia, unspecified: Secondary | ICD-10-CM | POA: Diagnosis not present

## 2023-07-22 DIAGNOSIS — Z1211 Encounter for screening for malignant neoplasm of colon: Secondary | ICD-10-CM | POA: Diagnosis not present

## 2023-07-22 DIAGNOSIS — R944 Abnormal results of kidney function studies: Secondary | ICD-10-CM | POA: Diagnosis not present

## 2023-07-23 ENCOUNTER — Encounter (INDEPENDENT_AMBULATORY_CARE_PROVIDER_SITE_OTHER): Payer: Self-pay | Admitting: *Deleted

## 2023-08-05 ENCOUNTER — Telehealth: Payer: Self-pay

## 2023-08-05 NOTE — Telephone Encounter (Signed)
 Who is your primary care physician: Dr.Zach Shona  Reasons for the colonoscopy: screening  Have you had a colonoscopy before?  no  Do you have family history of colon cancer? no  Previous colonoscopy with polyps removed? no  Do you have a history colorectal cancer?   no  Are you diabetic? If yes, Type 1 or Type 2?    no  Do you have a prosthetic or mechanical heart valve? no  Do you have a pacemaker/defibrillator?   no  Have you had endocarditis/atrial fibrillation? no  Have you had joint replacement within the last 12 months?  no  Do you tend to be constipated or have to use laxatives? no  Do you have any history of drugs or alchohol?  no  Do you use supplemental oxygen?  no  Have you had a stroke or heart attack within the last 6 months? no  Do you take weight loss medication?  no    Do you take any blood-thinning medications such as: (aspirin, warfarin, Plavix, Aggrenox)  no  If yes we need the name, milligram, dosage and who is prescribing doctor  No current outpatient medications on file prior to visit.   No current facility-administered medications on file prior to visit.    No Known Allergies   Pharmacy: [facility]  Primary Insurance Name: Normie OLIPHANT [id number]  Best number where you can be reached: [phone]

## 2023-09-05 NOTE — Telephone Encounter (Signed)
 OK to schedule.  ASA 1/2

## 2023-09-09 NOTE — Telephone Encounter (Signed)
 LMOVM to return call.

## 2023-09-18 ENCOUNTER — Other Ambulatory Visit: Payer: Self-pay | Admitting: *Deleted

## 2023-09-18 ENCOUNTER — Encounter: Payer: Self-pay | Admitting: *Deleted

## 2023-09-18 ENCOUNTER — Encounter (INDEPENDENT_AMBULATORY_CARE_PROVIDER_SITE_OTHER): Payer: Self-pay | Admitting: *Deleted

## 2023-09-18 MED ORDER — PEG 3350-KCL-NA BICARB-NACL 420 G PO SOLR: 4000.0000 mL | Freq: Once | ORAL | 0 refills | Status: AC

## 2023-09-18 NOTE — Telephone Encounter (Signed)
 Pt states will call back later this afternoon, he needs to find out the dates his wife will be off

## 2023-09-18 NOTE — Telephone Encounter (Signed)
 Referral completed, [facility] apt letter sent to PCP

## 2023-09-18 NOTE — Telephone Encounter (Signed)
 Pt has been scheduled for 10/13/23 with Dr.Carver, instructions mailed and prep sent to pharmacy.

## 2023-10-09 ENCOUNTER — Encounter (HOSPITAL_COMMUNITY)
Admission: RE | Admit: 2023-10-09 | Discharge: 2023-10-09 | Disposition: A | Discharge status: Home / Self Care | Source: Ambulatory Visit | Attending: Internal Medicine | Admitting: Internal Medicine

## 2023-10-13 ENCOUNTER — Encounter (HOSPITAL_COMMUNITY): Payer: Self-pay | Admitting: Internal Medicine

## 2023-10-13 ENCOUNTER — Ambulatory Visit (HOSPITAL_COMMUNITY): Payer: Self-pay | Admitting: Anesthesiology

## 2023-10-13 ENCOUNTER — Other Ambulatory Visit: Payer: Self-pay

## 2023-10-13 ENCOUNTER — Ambulatory Visit (HOSPITAL_COMMUNITY)
Admission: RE | Admit: 2023-10-13 | Discharge: 2023-10-13 | Disposition: A | Discharge status: Home / Self Care | Attending: Internal Medicine | Admitting: Internal Medicine

## 2023-10-13 ENCOUNTER — Encounter (HOSPITAL_COMMUNITY)
Admission: RE | Disposition: A | Discharge status: Home / Self Care | Payer: Self-pay | Source: Home / Self Care | Attending: Internal Medicine

## 2023-10-13 DIAGNOSIS — K644 Residual hemorrhoidal skin tags: Secondary | ICD-10-CM | POA: Diagnosis not present

## 2023-10-13 DIAGNOSIS — K648 Other hemorrhoids: Secondary | ICD-10-CM | POA: Diagnosis not present

## 2023-10-13 DIAGNOSIS — F1290 Cannabis use, unspecified, uncomplicated: Secondary | ICD-10-CM | POA: Insufficient documentation

## 2023-10-13 DIAGNOSIS — Z1211 Encounter for screening for malignant neoplasm of colon: Secondary | ICD-10-CM | POA: Diagnosis not present

## 2023-10-13 HISTORY — PX: COLONOSCOPY: SHX5424

## 2023-10-13 SURGERY — COLONOSCOPY
Anesthesia: General

## 2023-10-13 MED ORDER — PROPOFOL 500 MG/50ML IV EMUL
INTRAVENOUS | Status: DC | PRN
Start: 2023-10-13 — End: 2023-10-13
  Administered 2023-10-13: 140 mg via INTRAVENOUS
  Administered 2023-10-13: 125 ug/kg/min via INTRAVENOUS

## 2023-10-13 MED ORDER — LACTATED RINGERS IV SOLN: INTRAVENOUS | Status: DC | PRN

## 2023-10-13 NOTE — Transfer of Care (Signed)
 Immediate Anesthesia Transfer of Care Note  Patient: Larry Mccarthy  Procedure(s) Performed: COLONOSCOPY  Patient Location: Short Stay  Anesthesia Type:General  Level of Consciousness: awake  Airway & Oxygen Therapy: Patient Spontanous Breathing  Post-op Assessment: Report given to RN and Post -op Vital signs reviewed and stable  Post vital signs: Reviewed and stable  Last Vitals:  Vitals Value Taken Time  BP 105/65 10/13/23 08:06  Temp 36.5 C 10/13/23 08:06  Pulse 57 10/13/23 08:06  Resp 18 10/13/23 08:06  SpO2 98 10/13/2023  0808    Last Pain:  Vitals:   10/13/23 0806  TempSrc: Oral  PainSc: 0-No pain      Patients Stated Pain Goal: 6 (10/13/23 0806)  Complications: No notable events documented.

## 2023-10-13 NOTE — H&P (Signed)
 Primary Care Physician:  Hyacinth Honey, NP Primary Gastroenterologist:  Dr. Cindie  Pre-Procedure History & Physical: HPI:  Larry Mccarthy is a 53 y.o. male is here for first ever colonoscopy for colon cancer screening purposes.  Patient denies any family history of colorectal cancer.  No melena or hematochezia.   History reviewed. No pertinent past medical history.  History reviewed. No pertinent surgical history.  Prior to Admission medications   Not on File    Allergies as of 09/18/2023   (No Known Allergies)    Family History  Problem Relation Age of Onset   Cancer Father     Social History   Socioeconomic History   Marital status: Married    Spouse name: Not on file   Number of children: Not on file   Years of education: Not on file   Highest education level: Not on file  Occupational History   Not on file  Tobacco Use   Smoking status: Never   Smokeless tobacco: Never  Substance and Sexual Activity   Alcohol use: Yes    Alcohol/week: 36.0 standard drinks of alcohol    Types: 1 Cans of beer, 35 Shots of liquor per week   Drug use: Yes    Types: Marijuana   Sexual activity: Not on file  Other Topics Concern   Not on file  Social History Narrative   Not on file   Social Drivers of Health   Financial Resource Strain: Not on file  Food Insecurity: Not on file  Transportation Needs: Not on file  Physical Activity: Not on file  Stress: Not on file  Social Connections: Not on file  Intimate Partner Violence: Not on file    Review of Systems: See HPI, otherwise negative ROS  Physical Exam: Vital signs in last 24 hours: Temp:  [97.7 F (36.5 C)] 97.7 F (36.5 C) (10/06 0643) Pulse Rate:  [54] 54 (10/06 0643) Resp:  [16] 16 (10/06 0643) BP: (116)/(77) 116/77 (10/06 9356)   General:   Alert,  Well-developed, well-nourished, pleasant and cooperative in NAD Head:  Normocephalic and atraumatic. Eyes:  Sclera clear, no icterus.   Conjunctiva pink. Ears:   Normal auditory acuity. Nose:  No deformity, discharge,  or lesions. Msk:  Symmetrical without gross deformities. Normal posture. Extremities:  Without clubbing or edema. Neurologic:  Alert and  oriented x4;  grossly normal neurologically. Skin:  Intact without significant lesions or rashes. Psych:  Alert and cooperative. Normal mood and affect.  Impression/Plan: Larry Mccarthy is here for a colonoscopy to be performed for colon cancer screening purposes.  The risks of the procedure including infection, bleed, or perforation as well as benefits, limitations, alternatives and imponderables have been reviewed with the patient. Questions have been answered. All parties agreeable.

## 2023-10-13 NOTE — Anesthesia Procedure Notes (Signed)
 Date/Time: 10/13/2023 7:30 AM  Performed by: Barbarann Verneita RAMAN, CRNAPre-anesthesia Checklist: Patient identified, Emergency Drugs available, Suction available, Timeout performed and Patient being monitored Patient Re-evaluated:Patient Re-evaluated prior to induction Oxygen Delivery Method: Nasal Cannula

## 2023-10-13 NOTE — Anesthesia Preprocedure Evaluation (Addendum)
 Anesthesia Evaluation  Patient identified by MRN, date of birth, ID band Patient awake    Reviewed: Allergy & Precautions, H&P , NPO status , Patient's Chart, lab work & pertinent test results  Airway Mallampati: I  TM Distance: >3 FB Neck ROM: Full    Dental no notable dental hx.    Pulmonary neg pulmonary ROS Smokes marijuana occasionally; last smoked one week ago   Pulmonary exam normal breath sounds clear to auscultation       Cardiovascular negative cardio ROS Normal cardiovascular exam Rhythm:Regular Rate:Normal     Neuro/Psych negative neurological ROS  negative psych ROS   GI/Hepatic negative GI ROS, Neg liver ROS,,,  Endo/Other  negative endocrine ROS    Renal/GU negative Renal ROS  negative genitourinary   Musculoskeletal negative musculoskeletal ROS (+)    Abdominal   Peds negative pediatric ROS (+)  Hematology negative hematology ROS (+)   Anesthesia Other Findings   Reproductive/Obstetrics negative OB ROS                              Anesthesia Physical Anesthesia Plan  ASA: 1  Anesthesia Plan: General   Post-op Pain Management:    Induction: Intravenous  PONV Risk Score and Plan:   Airway Management Planned: Nasal Cannula  Additional Equipment:   Intra-op Plan:   Post-operative Plan:   Informed Consent: I have reviewed the patients History and Physical, chart, labs and discussed the procedure including the risks, benefits and alternatives for the proposed anesthesia with the patient or authorized representative who has indicated his/her understanding and acceptance.     Dental advisory given  Plan Discussed with: CRNA  Anesthesia Plan Comments:          Anesthesia Quick Evaluation

## 2023-10-13 NOTE — Op Note (Signed)
 [facility] Patient Name: Larry Mccarthy Procedure Date: 10/13/2023 7:09 AM MRN: [id number] Date of Birth: 07-03-1970 Attending MD: Carlin POUR. Cindie , [place], [id number] CSN: [id number] Age: 53 Admit Type: Outpatient Procedure:                Colonoscopy Indications:              Screening for colorectal malignant neoplasm Providers:                Carlin POUR. Cindie, DO, Crystal Page, [place],                            Technician Referring MD:              Medicines:                See the Anesthesia note for documentation of the                            administered medications Complications:            No immediate complications. Estimated Blood Loss:     Estimated blood loss: none. Procedure:                Pre-Anesthesia Assessment:                           - The anesthesia plan was to use monitored                            anesthesia care (MAC).                           After obtaining informed consent, the colonoscope                            was passed under direct vision. Throughout the                            procedure, the patient's blood pressure, pulse, and                            oxygen saturations were monitored continuously. The                            PCF-HQ190L ([id number]) Peds Colon was introduced                            through the anus and advanced to the the cecum,                            identified by appendiceal orifice and ileocecal                            valve. The colonoscopy was performed without                            difficulty. The patient tolerated the procedure  well. The quality of the bowel preparation was                            evaluated using the BBPS [facility] Bowel Preparation                            Scale) with scores of: Right Colon = 3, Transverse                            Colon = 3 and Left Colon = 3 (entire mucosa seen                            well with no residual staining,  small fragments of                            stool or opaque liquid). The total BBPS score                            equals 9. Scope In: 7:48:10 AM Scope Out: 7:59:39 AM Scope Withdrawal Time: 0 hours 7 minutes 23 seconds  Total Procedure Duration: 0 hours 11 minutes 29 seconds  Findings:      Hemorrhoids were found on perianal exam.      Non-bleeding internal hemorrhoids were found.      The exam was otherwise without abnormality. Impression:               - Hemorrhoids found on perianal exam.                           - Non-bleeding internal hemorrhoids.                           - The examination was otherwise normal.                           - No specimens collected. Moderate Sedation:      Per Anesthesia Care Recommendation:           - Patient has a contact number available for                            emergencies. The signs and symptoms of potential                            delayed complications were discussed with the                            patient. Return to normal activities tomorrow.                            Written discharge instructions were provided to the                            patient.                           -  Resume previous diet.                           - Continue present medications.                           - Repeat colonoscopy in 10 years for screening                            purposes.                           - Return to GI clinic PRN. Procedure Code(s):        --- Professional ---                           H9878, Colorectal cancer screening; colonoscopy on                            individual not meeting criteria for high risk Diagnosis Code(s):        --- Professional ---                           Z12.11, Encounter for screening for malignant                            neoplasm of colon                           K64.8, Other hemorrhoids CPT copyright 2022 [employer]. All rights reserved. The codes documented in  this report are preliminary and upon coder review may  be revised to meet current compliance requirements. Carlin POUR. Cindie, DO Carlin POUR. Safwan Tomei, DO 10/13/2023 8:01:53 AM This report has been signed electronically. Number of Addenda: 0

## 2023-10-13 NOTE — Anesthesia Postprocedure Evaluation (Signed)
 Anesthesia Post Note  Patient: Larry Mccarthy  Procedure(s) Performed: COLONOSCOPY  Patient location during evaluation: PACU Anesthesia Type: General Level of consciousness: awake and alert Pain management: pain level controlled Vital Signs Assessment: post-procedure vital signs reviewed and stable Respiratory status: spontaneous breathing, nonlabored ventilation, respiratory function stable and patient connected to nasal cannula oxygen Cardiovascular status: blood pressure returned to baseline and stable Postop Assessment: no apparent nausea or vomiting Anesthetic complications: no   No notable events documented.   Last Vitals:  Vitals:   10/13/23 0643 10/13/23 0806  BP: 116/77 105/65  Pulse: (!) 54 (!) 57  Resp: 16 18  Temp: 36.5 C 36.5 C    Last Pain:  Vitals:   10/13/23 0806  TempSrc: Oral  PainSc: 0-No pain                 Andrea Limes

## 2023-10-13 NOTE — Discharge Instructions (Signed)

## 2023-10-17 ENCOUNTER — Encounter (HOSPITAL_COMMUNITY): Payer: Self-pay | Admitting: Internal Medicine
